# Patient Record
Sex: Male | Born: 1985 | Race: Black or African American | Hispanic: No | Marital: Married | Smoking: Never smoker
Health system: Southern US, Community
[De-identification: ages and names within clinical notes are randomized; demographics above are authoritative.]

## PROBLEM LIST (undated history)

## (undated) HISTORY — PX: HAND SURGERY: SHX662

## (undated) HISTORY — PX: REPLACEMENT TOTAL KNEE: SUR1224

---

## 2017-09-02 ENCOUNTER — Ambulatory Visit
Admission: EM | Admit: 2017-09-02 | Discharge: 2017-09-02 | Disposition: A | Discharge status: Home / Self Care | Payer: PRIVATE HEALTH INSURANCE | Attending: Family Medicine | Admitting: Family Medicine

## 2017-09-02 ENCOUNTER — Other Ambulatory Visit: Payer: Self-pay

## 2017-09-02 DIAGNOSIS — F410 Panic disorder [episodic paroxysmal anxiety] without agoraphobia: Secondary | ICD-10-CM

## 2017-09-02 DIAGNOSIS — F419 Anxiety disorder, unspecified: Secondary | ICD-10-CM | POA: Diagnosis not present

## 2017-09-02 MED ORDER — CLONAZEPAM 0.5 MG PO TABS: 0.5000 mg | ORAL_TABLET | Freq: Two times a day (BID) | ORAL | 0 refills | Status: AC | PRN

## 2017-09-02 NOTE — Discharge Instructions (Signed)
Consider [facility].   Medication as directed.  Take care  Dr. Adriana Simasook

## 2017-09-02 NOTE — ED Provider Notes (Signed)
[facility]   CSN: [id number] Arrival date & time: 09/02/17  1223  History   Chief Complaint Chief Complaint  Patient presents with  . Anxiety   HPI  32 year old male presents with the above complaint.  Patient reports that for the past 2 months has had ongoing anxiety and panic.  He states that these occur intermittently.  He describes it as a sense of impending doom.  Associated palpitations.  He states it is often provoked when he discusses his ongoing issues.  He states that he is currently having marital problems as well as concern about thyroid cancer as he is awaiting a thyroid ultrasound.  He has had recent thyroid studies done which were unremarkable.  No known relieving factors.  He has not seen a counselor or therapist.  He has consulted with a tele-doc who recommended that he be evaluated.  No other associated symptoms.  No other complaints.  PMH - Hx of hyperthyroidism  Past Surgical History:  Procedure Laterality Date  . HAND SURGERY Left   . REPLACEMENT TOTAL KNEE Right    Home Medications    Prior to Admission medications   Medication Sig Start Date End Date Taking? Authorizing Provider  clonazePAM (KLONOPIN) 0.5 MG tablet Take 1 tablet (0.5 mg total) by mouth 2 (two) times daily as needed for anxiety. 09/02/17   Tommie Sams, DO   Family History No reported family hx.    Social History Social History   Tobacco Use  . Smoking status: Never Smoker  . Smokeless tobacco: Current User    Types: Chew, Snuff  Substance Use Topics  . Alcohol use: Yes    Comment: occasionally  . Drug use: Never   Allergies   Patient has no known allergies.  Review of Systems Review of Systems  Cardiovascular: Positive for palpitations.  Psychiatric/Behavioral: The patient is nervous/anxious.    Physical Exam Triage Vital Signs ED Triage Vitals  Enc Vitals Group     BP 09/02/17 1243 (!) 156/93     Pulse Rate 09/02/17 1243 67     Resp 09/02/17 1243 18   Temp 09/02/17 1243 98.3 F (36.8 C)     Temp Source 09/02/17 1243 Oral     SpO2 09/02/17 1243 100 %     Weight 09/02/17 1240 190 lb (86.2 kg)     Height 09/02/17 1240 5\' 9"  (1.753 m)     Head Circumference --      Peak Flow --      Pain Score 09/02/17 1239 0     Pain Loc --      Pain Edu? --      Excl. in GC? --    Updated Vital Signs BP (!) 156/93 (BP Location: Right Arm)   Pulse 67   Temp 98.3 F (36.8 C) (Oral)   Resp 18   Ht 5\' 9"  (1.753 m)   Wt 190 lb (86.2 kg)   SpO2 100%   BMI 28.06 kg/m  Physical Exam  Constitutional: He is oriented to person, place, and time. He appears well-developed. No distress.  HENT:  Head: Normocephalic and atraumatic.  Cardiovascular: Normal rate and regular rhythm.  Pulmonary/Chest: Effort normal and breath sounds normal. He has no wheezes. He has no rales.  Neurological: He is alert and oriented to person, place, and time.  Psychiatric: His behavior is normal.  Anxious.  Nursing note and vitals reviewed.  UC Treatments / Results  Labs (all labs ordered are listed, but only  abnormal results are displayed) Labs Reviewed - No data to display  EKG None  Radiology No results found.  Procedures Procedures (including critical care time)  Medications Ordered in UC Medications - No data to display  Initial Impression / Assessment and Plan / UC Course  I have reviewed the triage vital signs and the nursing notes.  Pertinent labs & imaging results that were available during my care of the patient were reviewed by me and considered in my medical decision making (see chart for details).    32 year old male presents with anxiety/panic.  Advised to see a Veterinary surgeoncounselor.  Klonopin as needed.   Final Clinical Impressions(s) / UC Diagnoses   Final diagnoses:  Anxiety  Panic attacks     Discharge Instructions     Consider [facility].   Medication as directed.  Take care  Dr. Adriana Simasook    ED Prescriptions    Medication Sig  Dispense Auth. Provider   clonazePAM (KLONOPIN) 0.5 MG tablet Take 1 tablet (0.5 mg total) by mouth 2 (two) times daily as needed for anxiety. 30 tablet Tommie Samsook, Meloni Hinz G, DO     Controlled Substance Prescriptions [place] Controlled Substance Registry consulted? Yes, I have consulted the [place] Controlled Substances Registry for this patient, and feel the risk/benefit ratio today is favorable for proceeding with this prescription for Klonopin. ** I could not find a record of him in the [place] database.   Tommie SamsCook, Iven Earnhart G, [place] 09/02/17 1333

## 2017-09-02 NOTE — ED Triage Notes (Addendum)
Patient complains of anxiety and panic attacks x 2 months. Patient states that this worsens when he gets out in the sun. Patient [place] and has moved here temporarily due to marital status. Patient states that he had an episode like this in the past and was issues with his thyroid.   Patient states that he had a thyroid ultrasound on Saturday but does not know the results from it.  Patient states that he was noticing hives at night.

## 2017-10-21 ENCOUNTER — Telehealth: Payer: Self-pay

## 2017-10-21 NOTE — Telephone Encounter (Signed)
Pt called requesting a refill of klonopin and I told him he really needed to est a pcp and confirmed he did not have feelings or thoughts of harming his self. He states he is working out of time and said he would try to come in the weekend but I did let him know it would be up to the doctors discretion and that we normally don't prescribe that medication

## 2019-11-22 ENCOUNTER — Ambulatory Visit
Admission: EM | Admit: 2019-11-22 | Discharge: 2019-11-22 | Disposition: A | Discharge status: Home / Self Care | Payer: PRIVATE HEALTH INSURANCE | Attending: Physician Assistant | Admitting: Physician Assistant

## 2019-11-22 ENCOUNTER — Ambulatory Visit (INDEPENDENT_AMBULATORY_CARE_PROVIDER_SITE_OTHER): Payer: PRIVATE HEALTH INSURANCE

## 2019-11-22 ENCOUNTER — Other Ambulatory Visit: Payer: Self-pay

## 2019-11-22 DIAGNOSIS — M25562 Pain in left knee: Secondary | ICD-10-CM

## 2019-11-22 DIAGNOSIS — M25572 Pain in left ankle and joints of left foot: Secondary | ICD-10-CM

## 2019-11-22 NOTE — ED Provider Notes (Signed)
[facility]    CSN: [id number] Arrival date & time: 11/22/19  1050      History   Chief Complaint Chief Complaint  Patient presents with  . Leg Pain    HPI Tracy Harrison is a 34 y.o. male.   Patient is a 34 year old male who presents with a chief complaint of left ankle and left knee pain for about 2 months.  Patient states he was participating in a orthotic television program where he was going over obstacles and slid down a fire pole.  Patient states he landed awkward coming off the fire pole, states it felt like he did not get as much support on his left knee from his thigh muscles and did on the right.  Patient also reports pain at the insertion point of the Achilles in the left heel.  Patient reports he is able to walk but does have some pain.  He was asked to come in to be evaluated by his employer.     History reviewed. No pertinent past medical history.  There are no problems to display for this patient.   Past Surgical History:  Procedure Laterality Date  . HAND SURGERY Left   . REPLACEMENT TOTAL KNEE Right        Home Medications    Prior to Admission medications   Medication Sig Start Date End Date Taking? Authorizing Provider  clonazePAM (KLONOPIN) 0.5 MG tablet Take 1 tablet (0.5 mg total) by mouth 2 (two) times daily as needed for anxiety. 09/02/17   Tommie Sams, DO    Family History No family history on file.  Social History Social History   Tobacco Use  . Smoking status: Never Smoker  . Smokeless tobacco: Current User    Types: Chew, Snuff  Vaping Use  . Vaping Use: Never used  Substance Use Topics  . Alcohol use: Yes    Comment: occasionally  . Drug use: Never     Allergies   Patient has no known allergies.   Review of Systems Review of Systems as above in HPI.  Other systems reviewed and found to be negative   Physical Exam Triage Vital Signs ED Triage Vitals  Enc Vitals Group     BP 11/22/19 1147 (!) 155/95      Pulse Rate 11/22/19 1147 64     Resp 11/22/19 1147 18     Temp 11/22/19 1147 98.1 F (36.7 C)     Temp Source 11/22/19 1147 Oral     SpO2 11/22/19 1147 100 %     Weight 11/22/19 1148 195 lb (88.5 kg)     Height 11/22/19 1148 5\' 9"  (1.753 m)     Head Circumference --      Peak Flow --      Pain Score 11/22/19 1147 3     Pain Loc --      Pain Edu? --      Excl. in GC? --    No data found.  Updated Vital Signs BP (!) 155/95   Pulse 64   Temp 98.1 F (36.7 C) (Oral)   Resp 18   Ht 5\' 9"  (1.753 m)   Wt 195 lb (88.5 kg)   SpO2 100%   BMI 28.80 kg/m    Physical Exam Constitutional:      Appearance: Normal appearance. He is not ill-appearing.  Musculoskeletal:        General: No swelling.       Legs:  Comments: No calf pain  Skin:    Capillary Refill: Capillary refill takes less than 2 seconds.  Neurological:     Mental Status: He is alert and oriented to person, place, and time.      UC Treatments / Results  Labs (all labs ordered are listed, but only abnormal results are displayed) Labs Reviewed - No data to display  EKG   Radiology DG Ankle Complete Left  Result Date: 11/22/2019 CLINICAL DATA:  Pain after sliding down fire pole EXAM: LEFT ANKLE COMPLETE - 3+ VIEW COMPARISON:  None. FINDINGS: Frontal, oblique, and lateral views were obtained. No fracture or joint effusion. Joint spaces appear normal. There is an inferior calcaneal spur. Ankle mortise appears intact. IMPRESSION: No evident fracture or appreciable arthropathy. Inferior calcaneal spur. Ankle mortise appears intact. Electronically Signed   By: Bretta Bang III M.D.   On: 11/22/2019 13:13   DG Knee Complete 4 Views Left  Result Date: 11/22/2019 CLINICAL DATA:  Pain after sliding down fire pole EXAM: LEFT KNEE - COMPLETE 4+ VIEW COMPARISON:  None. FINDINGS: Frontal, lateral, and bilateral oblique views were obtained. No fracture or dislocation. No joint effusion. Joint spaces appear normal.  No erosive change. IMPRESSION: No fracture or dislocation. No joint effusion. Joint spaces appear unremarkable. Electronically Signed   By: Bretta Bang III M.D.   On: 11/22/2019 13:10    Procedures Procedures (including critical care time)  Medications Ordered in UC Medications - No data to display  Initial Impression / Assessment and Plan / UC Course  I have reviewed the triage vital signs and the nursing notes.  Pertinent labs & imaging results that were available during my care of the patient were reviewed by me and considered in my medical decision making (see chart for details).     Patient with tenderness palpation of the insertion point of the Achilles tendon into the left heel and tenderness palpation of the tibial plateau the left knee.  We will check x-rays.  X-rays are negative.  Pain likely from a sprain of the type of injury that has been going on since due to repeat use.  Recommend rest ice elevation.  Ibuprofen and Tylenol.  Compression with the brace or Ace wrap.  Follow-up with primary care provider or orthopedics if needed. Final Clinical Impressions(s) / UC Diagnoses   Final diagnoses:  Left knee pain, unspecified chronicity  Left ankle pain, unspecified chronicity     Discharge Instructions     -No acute injuries noted on x-ray. -Injuries likely related to a sprain that occurred at the time of injury but continued to be irritated. -Rest ice and elevation as able -Ibuprofen and Tylenol as needed for pain -Compressive support like a brace or Ace wrap can help with pain until fully improved.  If no improvement over the next 7 to 10 days, can follow with primary care provider or orthopedics.    ED Prescriptions    None     PDMP not reviewed this encounter.   Candis Schatz, PA-C 11/22/19 1326

## 2019-11-22 NOTE — ED Triage Notes (Signed)
Pt reports having L leg and knee pain x2 months. No known injury to leg.

## 2019-11-22 NOTE — Discharge Instructions (Addendum)
-  No acute injuries noted on x-ray. -Injuries likely related to a sprain that occurred at the time of injury but continued to be irritated. -Rest ice and elevation as able -Ibuprofen and Tylenol as needed for pain -Compressive support like a brace or Ace wrap can help with pain until fully improved.  If no improvement over the next 7 to 10 days, can follow with primary care provider or orthopedics.

## 2021-08-21 IMAGING — CR DG ANKLE COMPLETE 3+V*L*
3 series · 3 of 3 positions shown · non-contrast
Comparison: None.

CLINICAL DATA: Pain after sliding down fire pole

EXAM:
LEFT ANKLE COMPLETE - 3+ VIEW

[ankle ap]
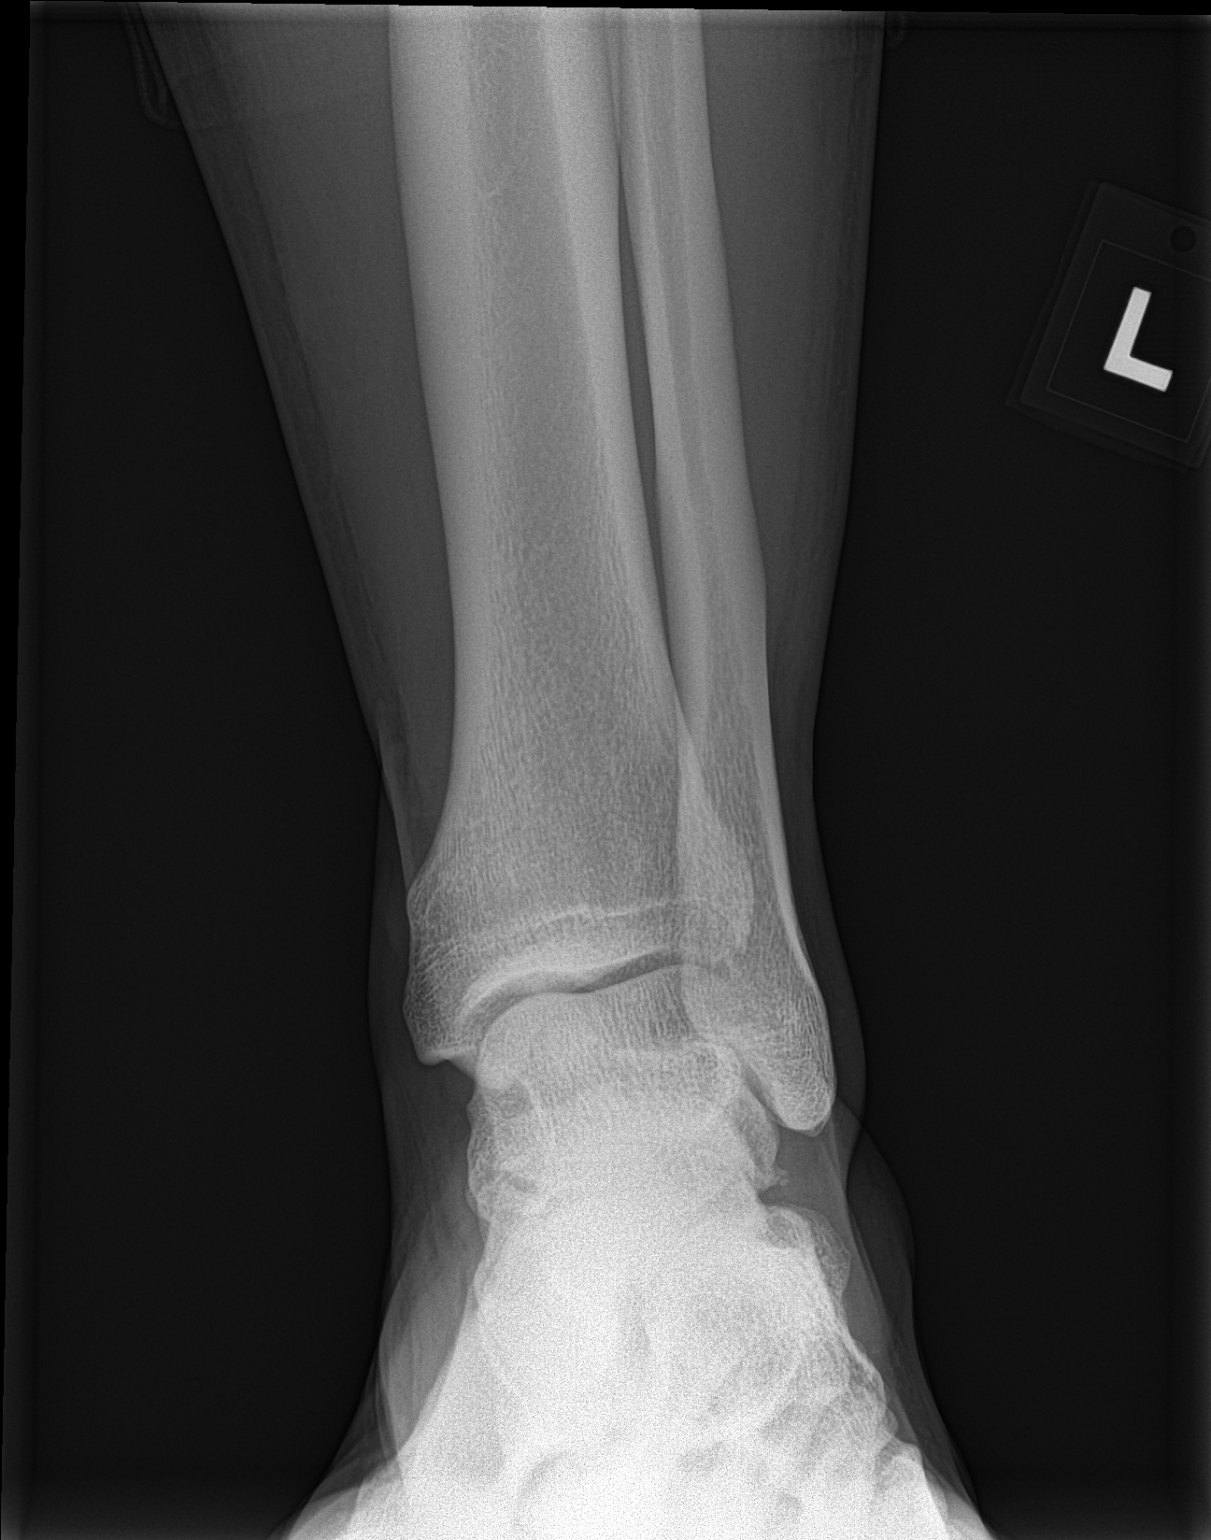

[ankle obl]
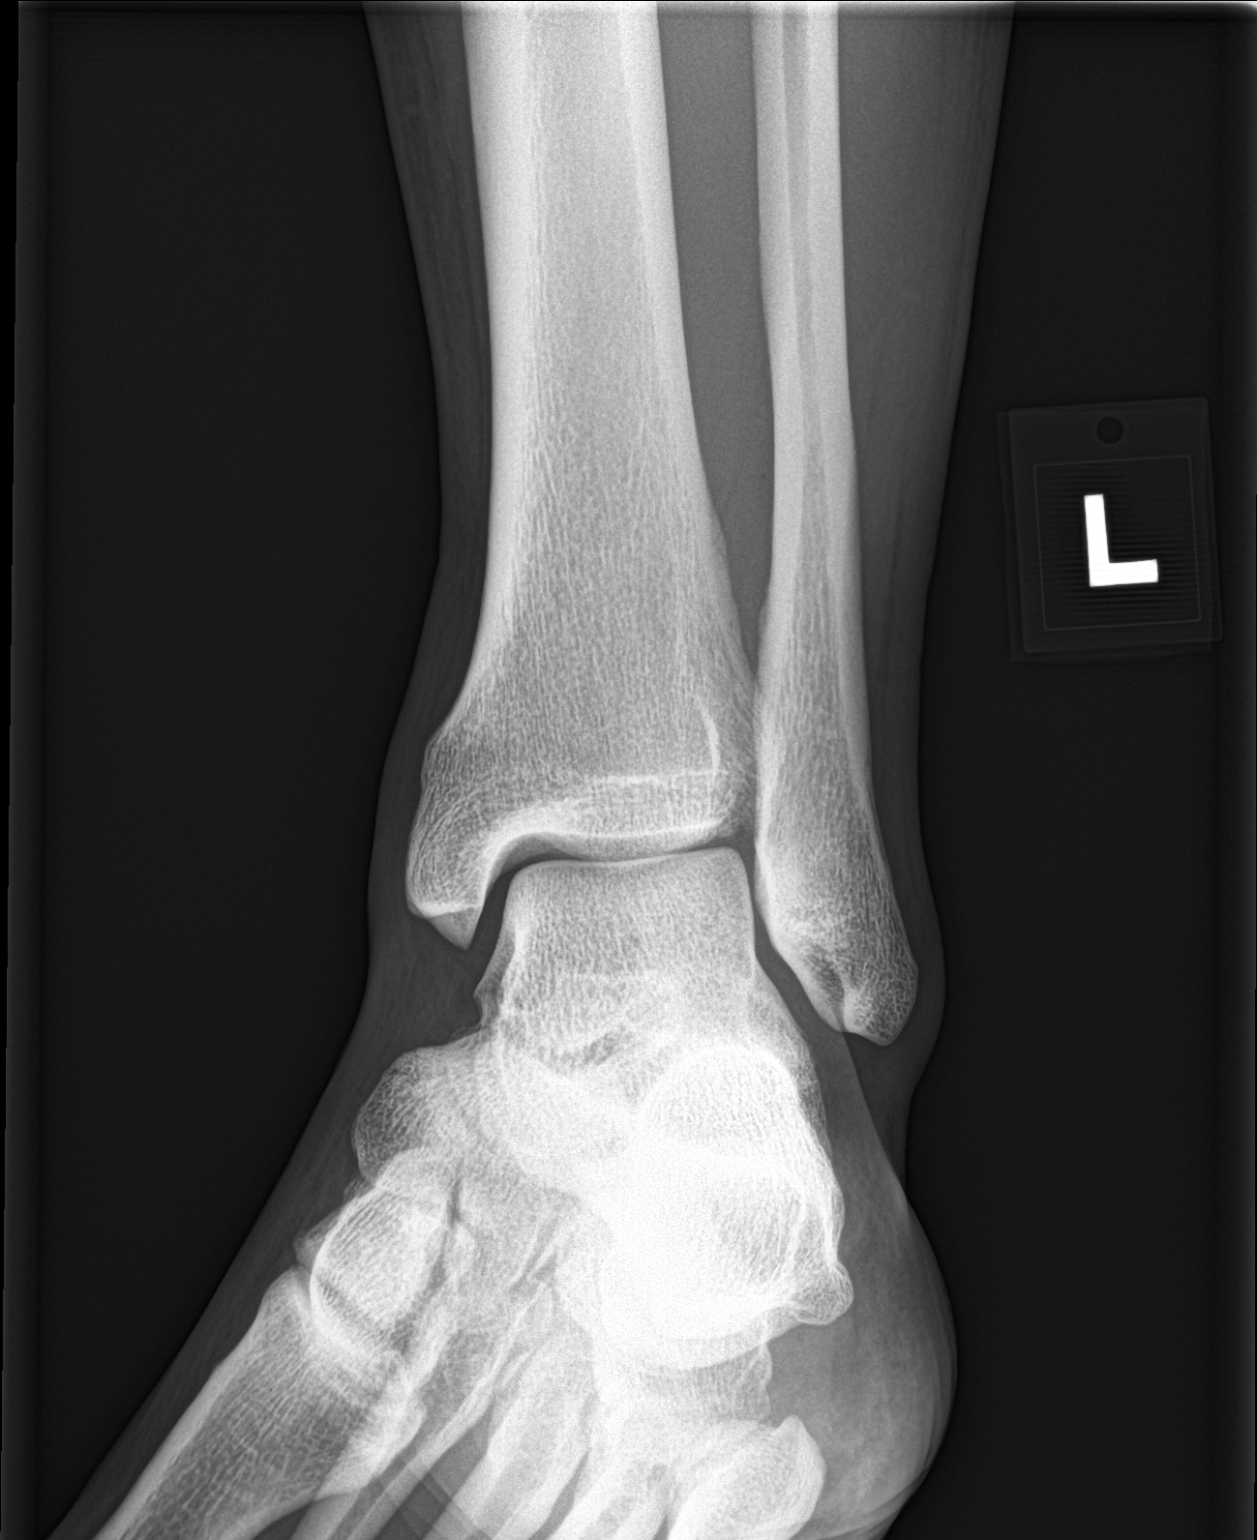

[ankle lat]
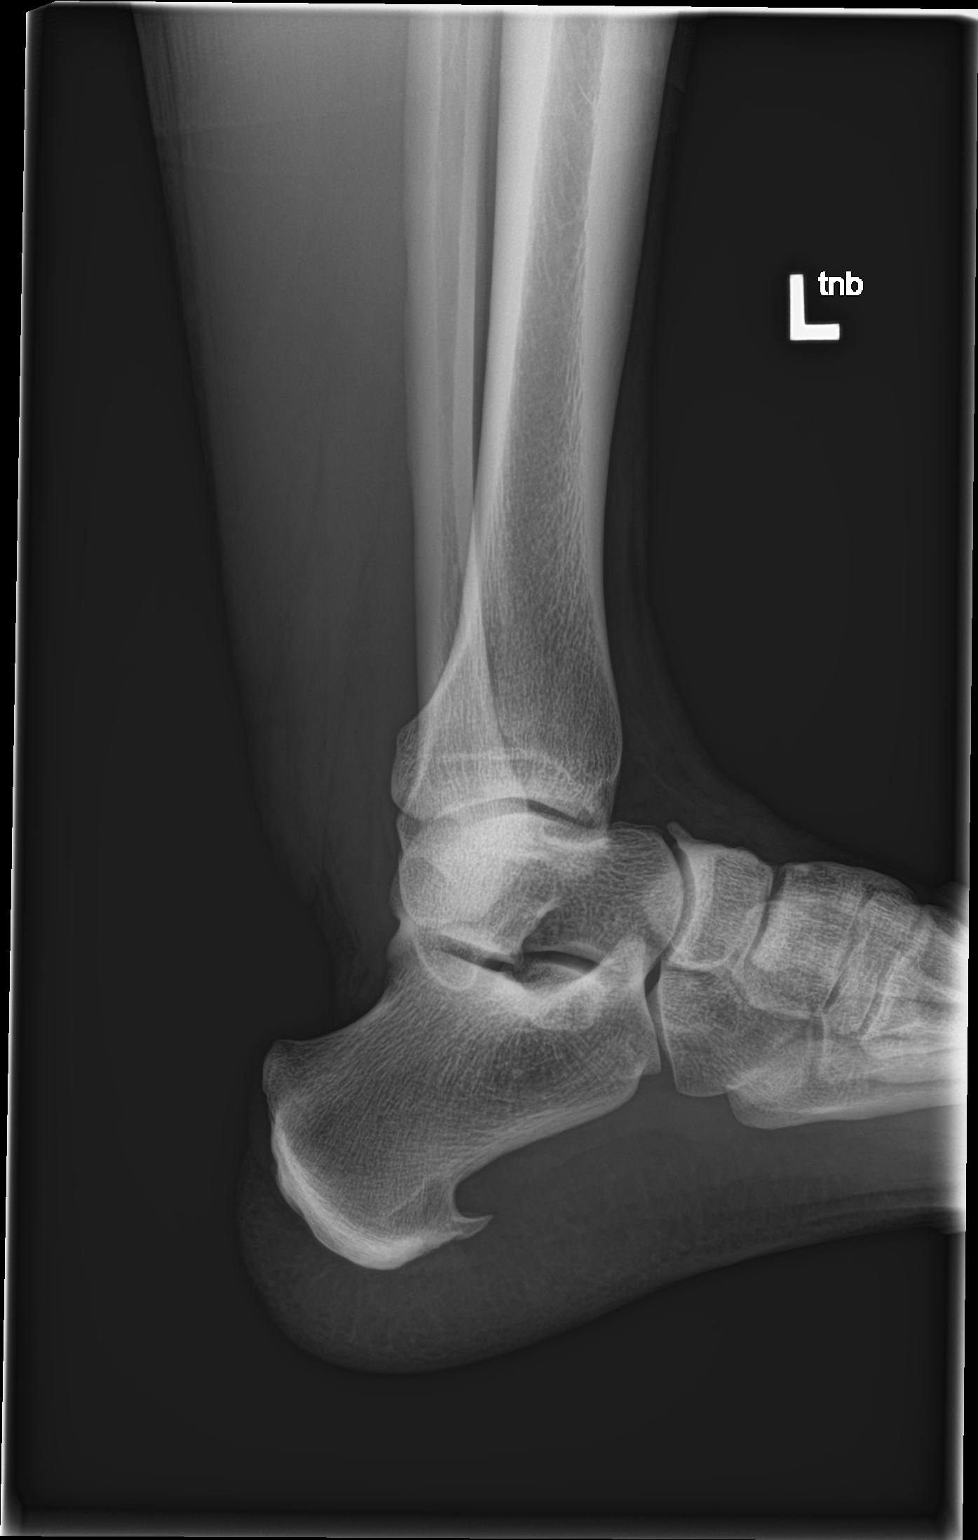

[3 of 3 positions shown; findings below may reference images not displayed]

FINDINGS: Frontal, oblique, and lateral views were obtained. No fracture or
joint effusion. Joint spaces appear normal. There is an inferior
calcaneal spur. Ankle mortise appears intact.
IMPRESSION: No evident fracture or appreciable arthropathy. Inferior calcaneal
spur. Ankle mortise appears intact.
